# Patient Record
Sex: Male | Born: 1980 | Race: White | Hispanic: No | Marital: Married | Smoking: Never smoker
Health system: Southern US, Community
[De-identification: ages and names within clinical notes are randomized; demographics above are authoritative.]

## PROBLEM LIST (undated history)

## (undated) DIAGNOSIS — G4730 Sleep apnea, unspecified: Secondary | ICD-10-CM

## (undated) HISTORY — PX: HERNIA REPAIR: SHX51

---

## 2006-10-04 ENCOUNTER — Ambulatory Visit: Payer: Self-pay | Admitting: General Surgery

## 2013-04-20 ENCOUNTER — Ambulatory Visit: Payer: Self-pay | Admitting: Internal Medicine

## 2013-05-25 ENCOUNTER — Ambulatory Visit: Payer: Self-pay | Admitting: Internal Medicine

## 2016-02-10 DIAGNOSIS — E785 Hyperlipidemia, unspecified: Secondary | ICD-10-CM | POA: Insufficient documentation

## 2018-01-13 ENCOUNTER — Ambulatory Visit: Payer: BLUE CROSS/BLUE SHIELD | Admitting: Podiatry

## 2018-01-13 DIAGNOSIS — B353 Tinea pedis: Secondary | ICD-10-CM

## 2018-01-13 DIAGNOSIS — B070 Plantar wart: Secondary | ICD-10-CM

## 2018-01-13 MED ORDER — TERBINAFINE HCL 250 MG PO TABS: 250.0000 mg | ORAL_TABLET | Freq: Every day | ORAL | 0 refills | Status: DC

## 2018-01-16 NOTE — Progress Notes (Signed)
   Subjective: 37 year old male presenting today as a new patient with a chief complaint of pain and tenderness to the plantar aspect of the right forefoot secondary to a plantar wart that appeared about one month ago. Walking on the foot increases the pain. He has been soaking the foot in Epsom salt for treatment. Patient is here for further evaluation and treatment.   No past medical history on file.  Objective: Physical Exam General: The patient is alert and oriented x3 in no acute distress.  Dermatology: Hyperkeratotic skin lesion noted to the plantar aspect of the right foot approximately 1 cm in diameter. Pinpoint bleeding noted upon debridement. Pruritus noted to bilateral feet with hyperkeratosis. Skin is warm, dry and supple bilateral lower extremities. Negative for open lesions or macerations.  Vascular: Palpable pedal pulses bilaterally. No edema or erythema noted. Capillary refill within normal limits.  Neurological: Epicritic and protective threshold grossly intact bilaterally.   Musculoskeletal Exam: Pain on palpation to the note skin lesion.  Range of motion within normal limits to all pedal and ankle joints bilateral. Muscle strength 5/5 in all groups bilateral.   Assessment: #1 plantar wart right foot #2 tinea pedis bilateral feet    Plan of Care:  #1 Patient was evaluated. #2 Excisional debridement of the plantar wart lesion was performed using a chisel blade. Cantharone was applied and the lesion was dressed with a dry sterile dressing. #3 Prescription for Lamisil 250 mg #28 provided to patient.  #4 patient is to return to clinic in 2 weeks.  Works Consulting civil engineer at [employer].    Felecia Shelling, DPM [facility]  Dr. Felecia Shelling, DPM    [address]                                        [place], [place] [address]                Office [phone]  Fax [phone]

## 2018-01-27 ENCOUNTER — Encounter: Payer: Self-pay | Admitting: Podiatry

## 2018-01-27 ENCOUNTER — Ambulatory Visit: Payer: BLUE CROSS/BLUE SHIELD | Admitting: Podiatry

## 2018-01-27 DIAGNOSIS — B070 Plantar wart: Secondary | ICD-10-CM | POA: Diagnosis not present

## 2018-01-27 DIAGNOSIS — Z85828 Personal history of other malignant neoplasm of skin: Secondary | ICD-10-CM | POA: Insufficient documentation

## 2018-01-27 DIAGNOSIS — M1990 Unspecified osteoarthritis, unspecified site: Secondary | ICD-10-CM | POA: Insufficient documentation

## 2018-01-27 DIAGNOSIS — G4730 Sleep apnea, unspecified: Secondary | ICD-10-CM | POA: Insufficient documentation

## 2018-01-27 MED ORDER — GENTAMICIN SULFATE 0.1 % EX CREA: 1.0000 "application " | TOPICAL_CREAM | Freq: Three times a day (TID) | CUTANEOUS | 1 refills | Status: DC

## 2018-01-30 NOTE — Progress Notes (Signed)
   Subjective: 37 year old male presenting today for follow up evaluation of a plantar wart of the right foot and tinea pedis of bilateral feet. He states both his complaints have improved and are doing much better. He denies any new complaints at this time. Patient is here for further evaluation and treatment.   No past medical history on file.  Objective: Physical Exam General: The patient is alert and oriented x3 in no acute distress.  Dermatology: Skin is cool, dry and supple bilateral lower extremities. Negative for open lesions or macerations.  Vascular: Palpable pedal pulses bilaterally. No edema or erythema noted. Capillary refill within normal limits.  Neurological: Epicritic and protective threshold grossly intact bilaterally.   Musculoskeletal Exam: All pedal and ankle joints range of motion within normal limits bilateral. Muscle strength 5/5 in all groups bilateral.    Assessment: #1 plantar wart right foot - resolved #2 tinea pedis bilateral feet - resolved    Plan of Care:  #1 Patient was evaluated. #2 Excisional debridement of the plantar wart lesion was performed using a chisel blade. Salinocaine was applied and the lesion was dressed with a dry sterile dressing. #3 Recommended good shoe gear.  #4 patient is to return to clinic as needed.  Works Consulting civil engineer at [employer].    Felecia Shelling, DPM [facility]  Dr. Felecia Shelling, DPM    [address]                                        [place], [place] [address]                Office [phone]  Fax [phone]

## 2019-02-14 ENCOUNTER — Other Ambulatory Visit: Payer: Self-pay | Admitting: Internal Medicine

## 2019-02-14 DIAGNOSIS — N6320 Unspecified lump in the left breast, unspecified quadrant: Secondary | ICD-10-CM

## 2019-02-22 ENCOUNTER — Other Ambulatory Visit: Payer: Self-pay

## 2019-02-22 ENCOUNTER — Ambulatory Visit
Admission: RE | Admit: 2019-02-22 | Discharge: 2019-02-22 | Disposition: A | Discharge status: Home / Self Care | Payer: BC Managed Care – PPO | Source: Ambulatory Visit | Attending: Internal Medicine | Admitting: Internal Medicine

## 2019-02-22 DIAGNOSIS — N6320 Unspecified lump in the left breast, unspecified quadrant: Secondary | ICD-10-CM

## 2019-11-16 IMAGING — MG DIGITAL DIAGNOSTIC BILATERAL MAMMOGRAM WITH TOMO AND CAD
6 of 10 series · 6 of 30 positions shown · non-contrast
Comparison: None

CLINICAL DATA: 37-year-old male with approximately 2 week history
of tenderness in the retroareolar left breast only when touched or
pressure is applied.

EXAM:
DIGITAL DIAGNOSTIC BILATERAL MAMMOGRAM WITH CAD AND TOMO

[R CC synth-2D]
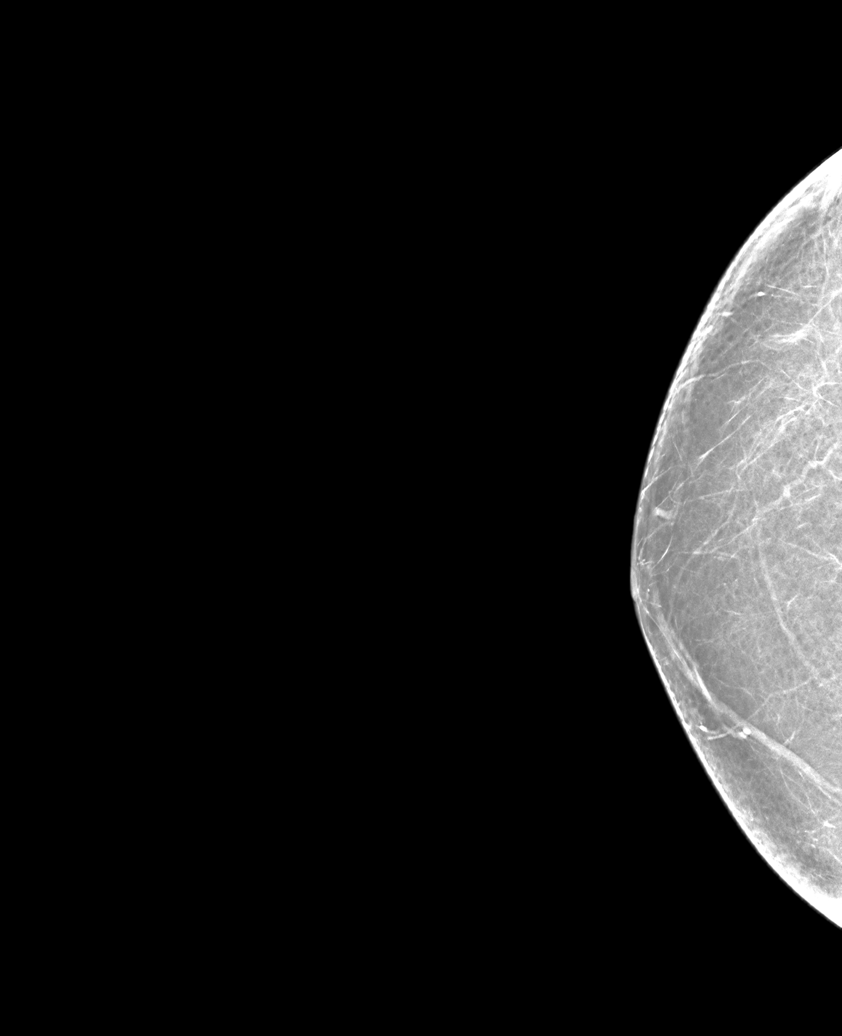

[R MLO synth-2D]
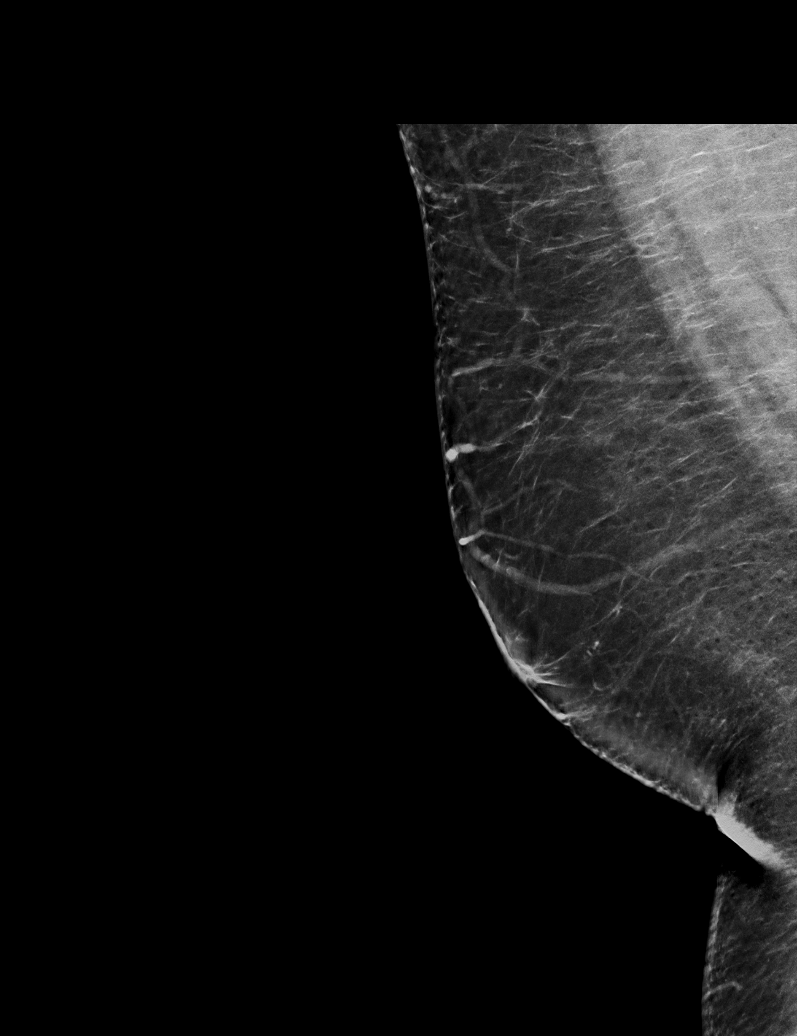

[L CC synth-2D]
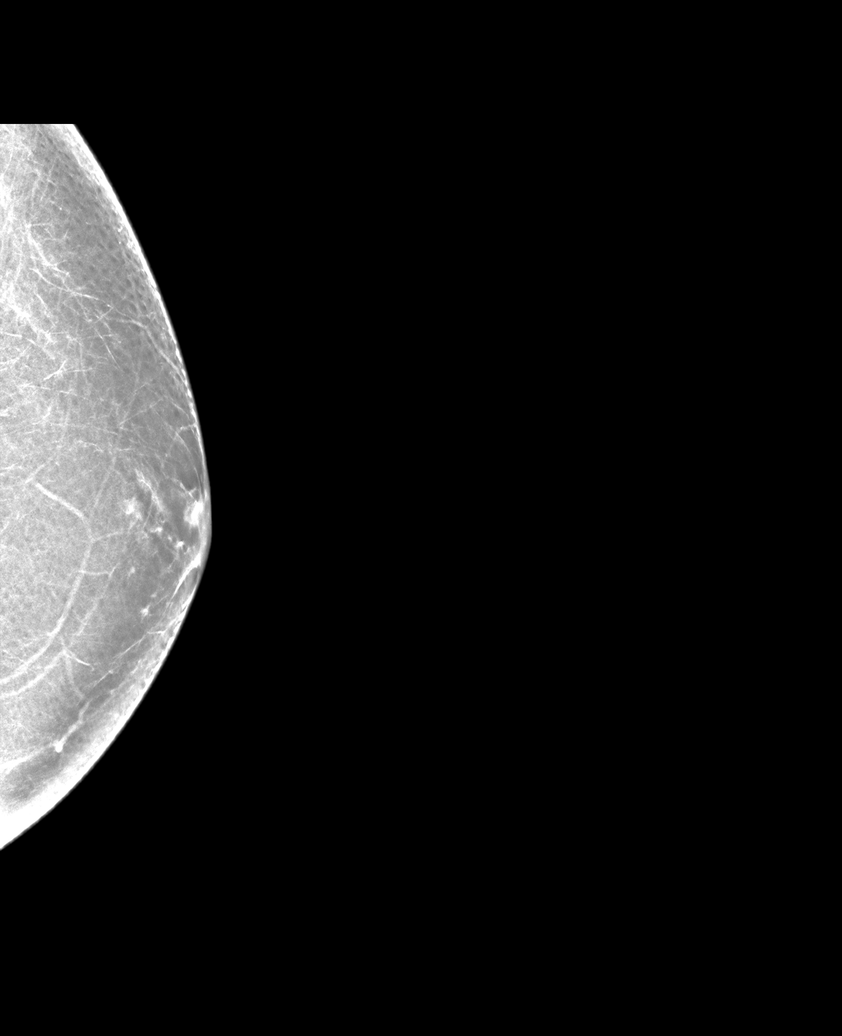

[L MLO synth-2D (1 of 2)]
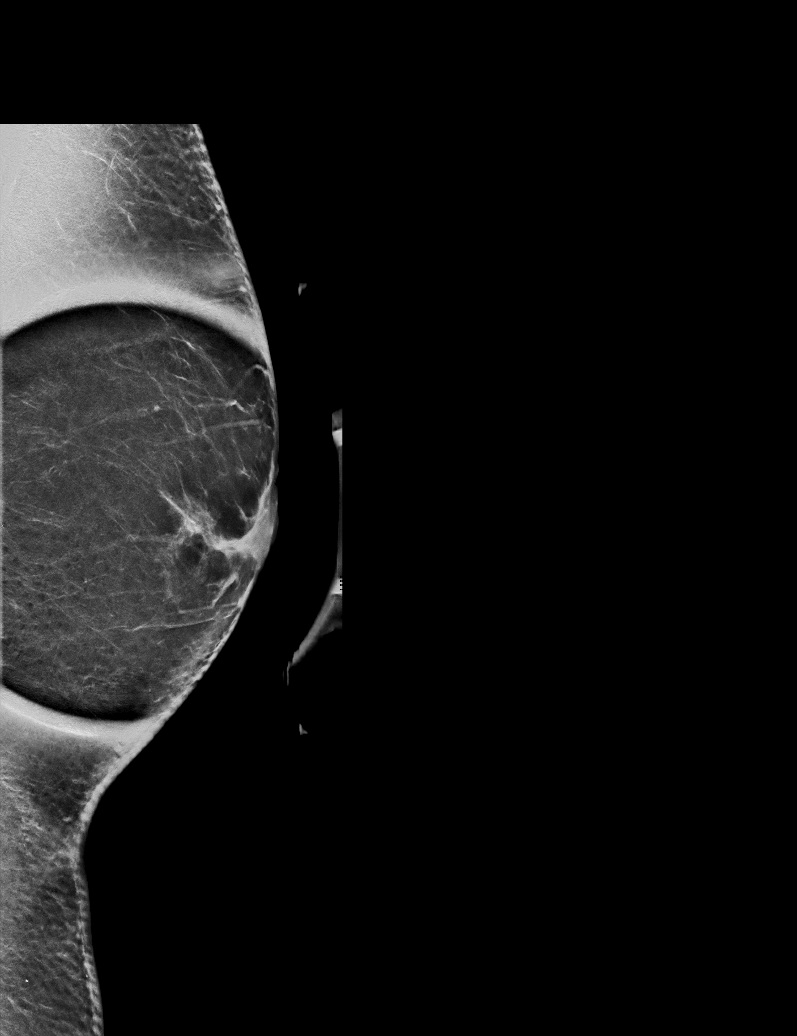

[L MLO synth-2D (2 of 2)]
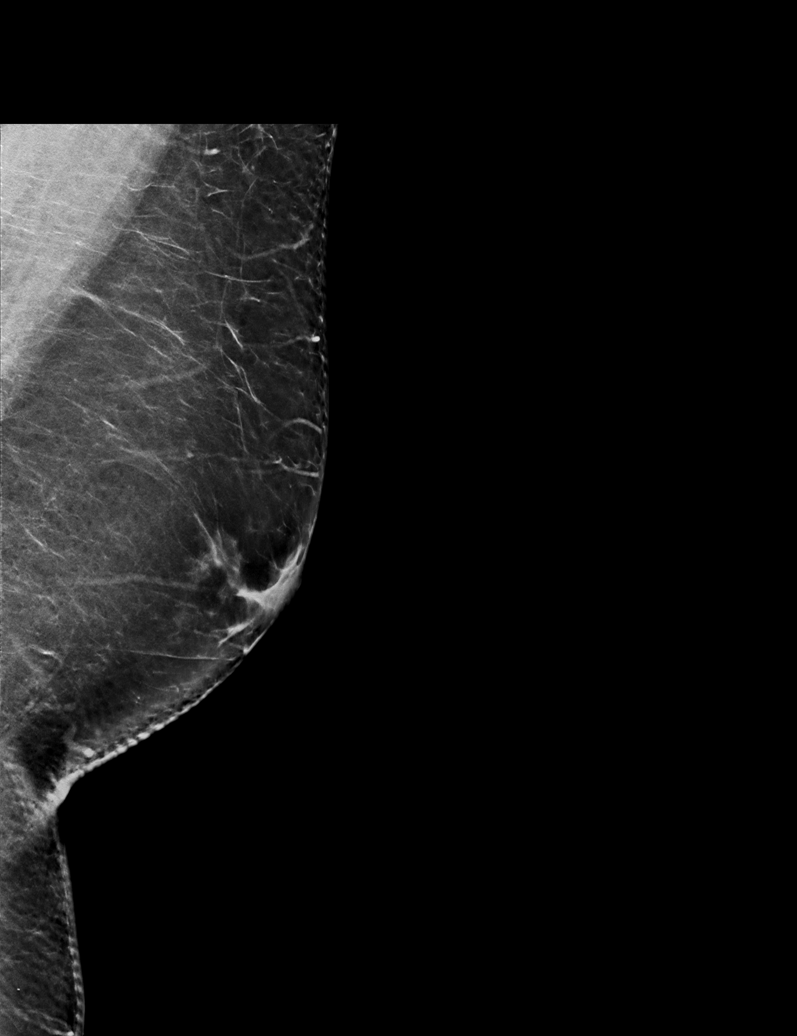

[L MLO tomo · tomo slice 40/79.0]
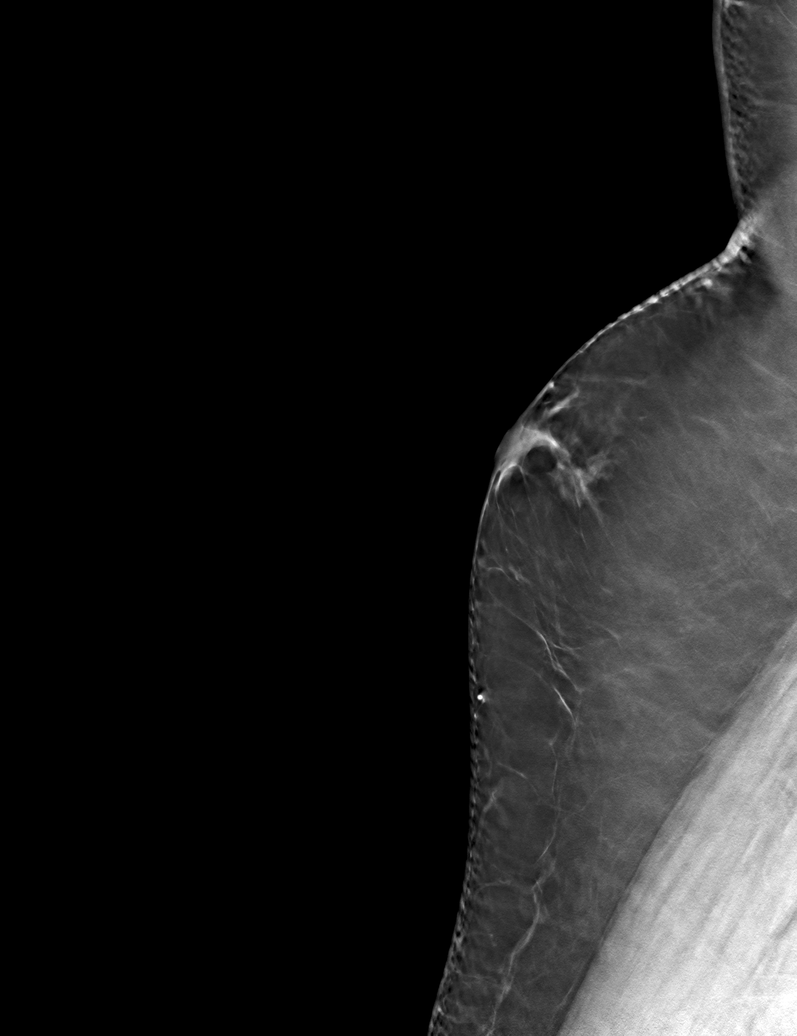

[6 of 30 positions shown; findings below may reference images not displayed]

ACR Breast Density Category b: There are scattered areas of
fibroglandular density.
FINDINGS: The right breast is negative. No mass, gynecomastia, suspicious
microcalcification, or distortion. Right axillary region is
negative.

On the left, there is mild retroareolar fibroglandular tissue
consistent with gynecomastia. No mass, distortion, suspicious
microcalcification. The left axillary region is negative.

Mammographic images were processed with CAD.
IMPRESSION: Mild left gynecomastia.

No evidence of malignancy in either breast.

RECOMMENDATION:
Clinical follow-up.

I have discussed the findings and recommendations with the patient.
Results were also provided in writing at the conclusion of the
visit. If applicable, a reminder letter will be sent to the patient
regarding the next appointment.

BI-RADS CATEGORY  2: Benign.

## 2022-07-04 ENCOUNTER — Ambulatory Visit
Admission: EM | Admit: 2022-07-04 | Discharge: 2022-07-04 | Disposition: A | Discharge status: Home / Self Care | Payer: BC Managed Care – PPO

## 2022-07-04 ENCOUNTER — Ambulatory Visit (INDEPENDENT_AMBULATORY_CARE_PROVIDER_SITE_OTHER): Payer: BC Managed Care – PPO

## 2022-07-04 DIAGNOSIS — S61310A Laceration without foreign body of right index finger with damage to nail, initial encounter: Secondary | ICD-10-CM

## 2022-07-04 DIAGNOSIS — Z23 Encounter for immunization: Secondary | ICD-10-CM | POA: Diagnosis not present

## 2022-07-04 MED ORDER — CEPHALEXIN 500 MG PO CAPS: 500.0000 mg | ORAL_CAPSULE | Freq: Two times a day (BID) | ORAL | 0 refills | Status: AC

## 2022-07-04 MED ORDER — TETANUS-DIPHTH-ACELL PERTUSSIS 5-2.5-18.5 LF-MCG/0.5 IM SUSY
0.5000 mL | PREFILLED_SYRINGE | Freq: Once | INTRAMUSCULAR | Status: AC
  Administered 2022-07-04: 0.5 mL via INTRAMUSCULAR

## 2022-07-04 NOTE — Discharge Instructions (Signed)
Your x-ray was normal.  Recommend wound care and monitoring for signs of infection that include increased redness, swelling, pus as we discussed.  Please change dressing daily and as needed if it becomes soiled.  Your tetanus vaccine was updated today.  I have prescribed an antibiotic given severity of wound.  Please follow-up with hand specialty for further evaluation and management.

## 2022-07-04 NOTE — ED Provider Notes (Signed)
[facility]    CSN: [id number] Arrival date & time: 07/04/22  1359      History   Chief Complaint Chief Complaint  Patient presents with   Finger Injury    HPI Clinton Johns is a 41 y.o. male.   Patient presents with laceration to right index finger that occurred about an hour prior to arrival to urgent care which would have been about 2:30 PM today.  Patient reports that he was using a table saw when his finger accidentally hit the blade.  He denies any numbness or tingling.  He is not sure when his last tetanus vaccine was.  Denies that he takes any blood thinning medications.  Has full range of motion of finger.  Patient reports that he washed it with water given that it had a lot of sawdust in it and then applied a dressing prior to arrival to urgent care.     History reviewed. No pertinent past medical history.  Patient Active Problem List   Diagnosis Date Noted   Arthritis 01/27/2018   History of skin cancer 01/27/2018   Sleep apnea 01/27/2018   Hyperlipidemia 02/10/2016    History reviewed. No pertinent surgical history.     Home Medications    Prior to Admission medications   Medication Sig Start Date End Date Taking? Authorizing Provider  cephALEXin (KEFLEX) 500 MG capsule Take 1 capsule (500 mg total) by mouth 2 (two) times daily for 5 days. 07/04/22 07/09/22 Yes Eddrick Dilone, Acie Fredrickson, FNP  sildenafil (VIAGRA) 100 MG tablet 1/2 to 1 tab PO daily as needed 08/03/21  Yes [provider]  terbinafine (LAMISIL) 250 MG tablet Take 1 tablet (250 mg total) by mouth daily. 01/13/18   Felecia Shelling, DPM    Family History History reviewed. No pertinent family history.  Social History Social History   Tobacco Use   Smoking status: Never   Smokeless tobacco: Never  Substance Use Topics   Alcohol use: Not Currently   Drug use: Never     Allergies   Patient has no known allergies.   Review of Systems Review of Systems Per  HPI  Physical Exam Triage Vital Signs ED Triage Vitals  Enc Vitals Group     BP 07/04/22 1454 135/71     Pulse Rate 07/04/22 1454 74     Resp 07/04/22 1454 18     Temp 07/04/22 1454 98.5 F (36.9 C)     Temp Source 07/04/22 1454 Oral     SpO2 07/04/22 1454 95 %     Weight --      Height --      Head Circumference --      Peak Flow --      Pain Score 07/04/22 1457 0     Pain Loc --      Pain Edu? --      Excl. in GC? --    No data found.  Updated Vital Signs BP 135/71 (BP Location: Left Arm)   Pulse 74   Temp 98.5 F (36.9 C) (Oral)   Resp 18   SpO2 95%   Visual Acuity Right Eye Distance:   Left Eye Distance:   Bilateral Distance:    Right Eye Near:   Left Eye Near:    Bilateral Near:     Physical Exam Constitutional:      General: He is not in acute distress.    Appearance: Normal appearance. He is not toxic-appearing or diaphoretic.  HENT:     Head: Normocephalic and atraumatic.  Eyes:     Extraocular Movements: Extraocular movements intact.     Conjunctiva/sclera: Conjunctivae normal.  Pulmonary:     Effort: Pulmonary effort is normal.  Musculoskeletal:       Hands:     Comments: Patient has full ROM of finger. Neurovascularly intact.   Skin:    Comments: Avulsion laceration present to right index finger that extends distally about an inch. Bleeding controlled. No obvious tendon or bone noted. Left medial portion of nail also removed but nail is intact to nailbed.   Neurological:     General: No focal deficit present.     Mental Status: He is alert and oriented to person, place, and time. Mental status is at baseline.  Psychiatric:        Mood and Affect: Mood normal.        Behavior: Behavior normal.        Thought Content: Thought content normal.        Judgment: Judgment normal.      UC Treatments / Results  Labs (all labs ordered are listed, but only abnormal results are displayed) Labs Reviewed - No data to  display  EKG   Radiology DG Finger Index Right  Result Date: 07/04/2022 CLINICAL DATA:  Finger injury. Index finger pain and laceration after accidental table saw injury. EXAM: RIGHT INDEX FINGER 2+V COMPARISON:  None Available. FINDINGS: Skin and soft tissue irregularity about the radial aspect of the distal digit. No radiopaque foreign body there is no evidence of fracture or dislocation. Minor degenerative spurring of the proximal digit. IMPRESSION: Soft tissue irregularity about the distal digit. No radiopaque foreign body or fracture. Electronically Signed   By: Narda Rutherford M.D.   On: 07/04/2022 15:55    Procedures Procedures (including critical care time)  Medications Ordered in UC Medications  Tdap (BOOSTRIX) injection 0.5 mL (0.5 mLs Intramuscular Given 07/04/22 1511)    Initial Impression / Assessment and Plan / UC Course  I have reviewed the triage vital signs and the nursing notes.  Pertinent labs & imaging results that were available during my care of the patient were reviewed by me and considered in my medical decision making (see chart for details).     Given depth and severity of avulsion laceration, suggested to patient that he go to the ER for further evaluation and management. He adamantly declined going to the ER. Risks associated with not going to the ER were discussed with patient. Patient voiced understanding. No closure is warranted given that a layer of skin was removed and no wound edges can be approximated. Nail is partially removed but still attached to nailbed so no need for removal of nail. Wound care and cleansing of wound completed. Tetanus vaccine also updated. X-ray was completed that showed no acute bony abnormality. Advised patient of wound care. Advised patient to monitor for signs of infection and follow up if they occur. I do think that antibiotic therapy prophylactically is necessary given it was a contaminated wound and given the depth of the  wound. Cephalexin was prescribed for this. I also think that it would be beneficial for patient to see hand specialty so he was provided with contact info and advised to follow up tomorrow to schedule an appt. Patient verbalized understanding and was agreeable with plan.  Final Clinical Impressions(s) / UC Diagnoses   Final diagnoses:  Laceration of right index finger without foreign body with damage to  nail, initial encounter     Discharge Instructions      Your x-ray was normal.  Recommend wound care and monitoring for signs of infection that include increased redness, swelling, pus as we discussed.  Please change dressing daily and as needed if it becomes soiled.  Your tetanus vaccine was updated today.  I have prescribed an antibiotic given severity of wound.  Please follow-up with hand specialty for further evaluation and management.     ED Prescriptions     Medication Sig Dispense Auth. Provider   cephALEXin (KEFLEX) 500 MG capsule Take 1 capsule (500 mg total) by mouth 2 (two) times daily for 5 days. 10 capsule Teodora Medici, [place]      PDMP not reviewed this encounter.   Teodora Medici,  07/04/22 [phone]

## 2022-07-04 NOTE — ED Triage Notes (Signed)
Pt. Presents to UC stating he sliced his right index finger with a table saw today.

## 2023-01-14 ENCOUNTER — Ambulatory Visit: Payer: Self-pay | Admitting: General Surgery

## 2023-01-14 NOTE — H&P (View-Only) (Signed)
HISTORY OF PRESENT ILLNESS:    Clinton Johns is a 41 y.o.male patient who comes for follow up of his umbilical hernia.   Patient previously seen 3 months ago due to umbilical hernia.  At that time he with minimally symptomatic umbilical hernia but with a BMI of 42.  Due to minimal symptomatic and his morbid obesity it was recommended patient to lose weight before considering surgical repair.  He endorses that he has been doing great.  He has lost at least 35 pounds since last visit.  He is feeling more comfortable.  He continued having discomfort in the umbilical area.  He feels very to get this hernia fixed.  He denies any episode of abdominal distention, nausea or vomiting.      PAST MEDICAL HISTORY:  Past Medical History      Past Medical History:  Diagnosis Date   Arthritis      mild, bilateral knees   Skin cancer      followed by Dermatology   Sleep apnea sleep study 08/14    CPAP 5          PAST SURGICAL HISTORY:   Past Surgical History       Past Surgical History:  Procedure Laterality Date   HERNIA REPAIR Bilateral      as a child           MEDICATIONS:  Encounter Medications        Outpatient Encounter Medications as of 12/30/2022  Medication Sig Dispense Refill   sildenafiL (VIAGRA) 100 MG tablet 1/2 to 1 tab PO daily as needed 10 tablet 11   tirzepatide (ZEPBOUND) 2.5 mg/0.5 mL pen injector Inject 0.5 mLs (2.5 mg total) subcutaneously every 7 (seven) days 2 mL 11    No facility-administered encounter medications on file as of 12/30/2022.        ALLERGIES:   Patient has no known allergies.   SOCIAL HISTORY:  Social History  Social History         Socioeconomic History   Marital status: Married  Tobacco Use   Smoking status: Never   Smokeless tobacco: Never  Vaping Use   Vaping status: Never Used  Substance and Sexual Activity   Alcohol use: Yes      Alcohol/week: 0.0 standard drinks of alcohol      Comment: occasional   Drug use: Never   Sexual  activity: Defer  Social History Narrative    Father was adopted.        FAMILY HISTORY:  Family History        Family History  Problem Relation Name Age of Onset   Lung cancer Other grandfather          GENERAL REVIEW OF SYSTEMS:    General ROS: negative for - chills, fatigue, fever, weight gain or weight loss Allergy and Immunology ROS: negative for - hives  Hematological and Lymphatic ROS: negative for - bleeding problems or bruising, negative for palpable nodes Endocrine ROS: negative for - heat or cold intolerance, hair changes Respiratory ROS: negative for - cough, shortness of breath or wheezing Cardiovascular ROS: no chest pain or palpitations GI ROS: negative for nausea, vomiting, abdominal pain, diarrhea, constipation Musculoskeletal ROS: negative for - joint swelling or muscle pain Neurological ROS: negative for - confusion, syncope Dermatological ROS: negative for pruritus and rash   PHYSICAL EXAM:     Vitals:    12/30/22 1516  BP: 111/72  Pulse: 66  .  Ht:185.4 cm (6' 1")   Wt:(!) 129.3 kg (285 lb) BSA:Body surface area is 2.58 meters squared. Body mass index is 37.6 kg/m..   GENERAL: Alert, active, oriented x3   HEENT: Pupils equal reactive to light. Extraocular movements are intact. Sclera clear. Palpebral conjunctiva normal red color.Pharynx clear.   NECK: Supple with no palpable mass and no adenopathy.   LUNGS: Sound clear with no rales rhonchi or wheezes.   HEART: Regular rhythm S1 and S2 without murmur.   ABDOMEN: Soft and depressible, nontender with no palpable mass, no hepatomegaly.  Moderate size umbilical hernia.,  Not completely able to be reduced.   EXTREMITIES: Well-developed well-nourished symmetrical with no dependent edema.   NEUROLOGICAL: Awake alert oriented, facial expression symmetrical, moving all extremities.      IMPRESSION:     Umbilical hernia without obstruction and without gangrene [K42.9]  -Partial incarcerated umbilical  hernia.  The patient has lost 35 pounds.  Now his BMI is 37.  Due to these great decrease in weight I think that patient is ready to proceed with umbilical hernia repair.  We discussed about robotic assisted laparoscopic umbilical hernia repair.  We discussed about the risk of surgery including pain, infection, bleeding, injury to adjacent organs such as intestine, perforation,  obstruction, among others.  The patient reported he understood and agreed to proceed with robotic acid laparoscopic umbilical hernia repair.         PLAN:  1.  Robotic assisted laprscopic ventral hernia repair with mesh (49594) 2. Avoid taking aspirin 5 days before surgery 3. Contact us if you have any concern.    Patient verbalized understanding, all questions were answered, and were agreeable with the plan outlined above.    Kathryne Ramella Cintron-Diaz, MD   Electronically signed by Jmarion Christiano Cintron-Diaz, MD 

## 2023-01-14 NOTE — H&P (Signed)
HISTORY OF PRESENT ILLNESS:    Clinton Johns is a 42 y.o.male patient who comes for follow up of his umbilical hernia.   Patient previously seen 3 months ago due to umbilical hernia.  At that time he with minimally symptomatic umbilical hernia but with a BMI of 42.  Due to minimal symptomatic and his morbid obesity it was recommended patient to lose weight before considering surgical repair.  He endorses that he has been doing great.  He has lost at least 35 pounds since last visit.  He is feeling more comfortable.  He continued having discomfort in the umbilical area.  He feels very to get this hernia fixed.  He denies any episode of abdominal distention, nausea or vomiting.      PAST MEDICAL HISTORY:  Past Medical History      Past Medical History:  Diagnosis Date   Arthritis      mild, bilateral knees   Skin cancer      followed by Dermatology   Sleep apnea sleep study 08/14    CPAP 5          PAST SURGICAL HISTORY:   Past Surgical History       Past Surgical History:  Procedure Laterality Date   HERNIA REPAIR Bilateral      as a child           MEDICATIONS:  Encounter Medications        Outpatient Encounter Medications as of 12/30/2022  Medication Sig Dispense Refill   sildenafiL (VIAGRA) 100 MG tablet 1/2 to 1 tab PO daily as needed 10 tablet 11   tirzepatide (ZEPBOUND) 2.5 mg/0.5 mL pen injector Inject 0.5 mLs (2.5 mg total) subcutaneously every 7 (seven) days 2 mL 11    No facility-administered encounter medications on file as of 12/30/2022.        ALLERGIES:   Patient has no known allergies.   SOCIAL HISTORY:  Social History  Social History         Socioeconomic History   Marital status: Married  Tobacco Use   Smoking status: Never   Smokeless tobacco: Never  Vaping Use   Vaping status: Never Used  Substance and Sexual Activity   Alcohol use: Yes      Alcohol/week: 0.0 standard drinks of alcohol      Comment: occasional   Drug use: Never   Sexual  activity: Defer  Social History Narrative    Father was adopted.        FAMILY HISTORY:  Family History        Family History  Problem Relation Name Age of Onset   Lung cancer Other grandfather          GENERAL REVIEW OF SYSTEMS:    General ROS: negative for - chills, fatigue, fever, weight gain or weight loss Allergy and Immunology ROS: negative for - hives  Hematological and Lymphatic ROS: negative for - bleeding problems or bruising, negative for palpable nodes Endocrine ROS: negative for - heat or cold intolerance, hair changes Respiratory ROS: negative for - cough, shortness of breath or wheezing Cardiovascular ROS: no chest pain or palpitations GI ROS: negative for nausea, vomiting, abdominal pain, diarrhea, constipation Musculoskeletal ROS: negative for - joint swelling or muscle pain Neurological ROS: negative for - confusion, syncope Dermatological ROS: negative for pruritus and rash   PHYSICAL EXAM:     Vitals:    12/30/22 1516  BP: 111/72  Pulse: 66  .  Ht:185.4 cm (6\' 1" )  Wt:(!) 129.3 kg (285 lb) ZOX:WRUE surface area is 2.58 meters squared. Body mass index is 37.6 kg/m.Marland Kitchen   GENERAL: Alert, active, oriented x3   HEENT: Pupils equal reactive to light. Extraocular movements are intact. Sclera clear. Palpebral conjunctiva normal red color.Pharynx clear.   NECK: Supple with no palpable mass and no adenopathy.   LUNGS: Sound clear with no rales rhonchi or wheezes.   HEART: Regular rhythm S1 and S2 without murmur.   ABDOMEN: Soft and depressible, nontender with no palpable mass, no hepatomegaly.  Moderate size umbilical hernia.,  Not completely able to be reduced.   EXTREMITIES: Well-developed well-nourished symmetrical with no dependent edema.   NEUROLOGICAL: Awake alert oriented, facial expression symmetrical, moving all extremities.      IMPRESSION:     Umbilical hernia without obstruction and without gangrene [K42.9]  -Partial incarcerated umbilical  hernia.  The patient has lost 35 pounds.  Now his BMI is 37.  Due to these great decrease in weight I think that patient is ready to proceed with umbilical hernia repair.  We discussed about robotic assisted laparoscopic umbilical hernia repair.  We discussed about the risk of surgery including pain, infection, bleeding, injury to adjacent organs such as intestine, perforation,  obstruction, among others.  The patient reported he understood and agreed to proceed with robotic acid laparoscopic umbilical hernia repair.         PLAN:  1.  Robotic assisted laprscopic ventral hernia repair with mesh ([id number]) 2. Avoid taking aspirin 5 days before surgery 3. Contact us if you have any concern.    Patient verbalized understanding, all questions were answered, and were agreeable with the plan outlined above.    Carolan Shiver, MD   Electronically signed by Carolan Shiver, MD

## 2023-01-27 ENCOUNTER — Encounter
Admission: RE | Admit: 2023-01-27 | Discharge: 2023-01-27 | Disposition: A | Discharge status: Home / Self Care | Payer: BC Managed Care – PPO | Source: Ambulatory Visit | Attending: General Surgery | Admitting: General Surgery

## 2023-01-27 ENCOUNTER — Other Ambulatory Visit: Payer: Self-pay

## 2023-01-27 HISTORY — DX: Sleep apnea, unspecified: G47.30

## 2023-01-27 NOTE — Patient Instructions (Signed)
Your procedure is scheduled on: Friday 02/04/23 To find out your arrival time, please call [phone] between 1PM - 3PM on:   Thursday 02/03/23 Report to the Registration Desk on the 1st floor of the [facility]. Valet parking is available.  If your arrival time is 6:00 am, do not arrive before that time as the [facility] entrance doors do not open until 6:00 am.  REMEMBER: Instructions that are not followed completely may result in serious medical risk, up to and including death; or upon the discretion of your surgeon and anesthesiologist your surgery may need to be rescheduled.  Do not eat food or drink any liquids after midnight the night before surgery.  No gum chewing or hard candies.  One week prior to surgery: Stop Anti-inflammatories (NSAIDS) such as Advil, Aleve, Ibuprofen, Motrin, Naproxen, Naprosyn and Aspirin based products such as Excedrin, Goody's Powder, BC Powder. You may however, continue to take Tylenol if needed for pain up until the day of surgery.  Stop ANY OVER THE COUNTER supplements or vitamins until after surgery.  Continue taking all prescribed medications.  TAKE ONLY THESE MEDICATIONS THE MORNING OF SURGERY WITH A SIP OF WATER:  none  No Alcohol for 24 hours before or after surgery.  No Smoking including e-cigarettes for 24 hours before surgery.  No chewable tobacco products for at least 6 hours before surgery.  No nicotine patches on the day of surgery.  Do not use any "recreational" drugs for at least a week (preferably 2 weeks) before your surgery.  Please be advised that the combination of cocaine and anesthesia may have negative outcomes, up to and including death. If you test positive for cocaine, your surgery will be cancelled.  On the morning of surgery brush your teeth with toothpaste and water, you may rinse your mouth with mouthwash if you wish. Do not swallow any toothpaste or mouthwash.  Use CHG Soap or wipes as directed on  instruction sheet. You may pick this up at our office OR if you prefer you can use antibacterial Dial soap.  Do not wear lotions, powders, or perfumes.   Do not shave body hair from the neck down 48 hours before surgery.  Wear comfortable clothing (specific to your surgery type) to the hospital.  Do not wear jewelry, make-up, hairpins, clips or nail polish.  Contact lenses, hearing aids and dentures may not be worn into surgery.  Bring your C-PAP to the hospital in case you may have to spend the night.   Do not bring valuables to the hospital. [facility] is not responsible for any missing/lost belongings or valuables.   Notify your doctor if there is any change in your medical condition (cold, fever, infection).  If you are being discharged the day of surgery, you will not be allowed to drive home. You will need a responsible individual to drive you home and stay with you for 24 hours after surgery.   If you are taking public transportation, you will need to have a responsible individual with you.  If you are being admitted to the hospital overnight, leave your suitcase in the car. After surgery it may be brought to your room.  In case of increased patient census, it may be necessary for you, the patient, to continue your postoperative care in the Same Day Surgery department.  After surgery, you can help prevent lung complications by doing breathing exercises.  Take deep breaths and cough every 1-2 hours. Your doctor may order a  device called an Incentive Spirometer to help you take deep breaths. When coughing or sneezing, hold a pillow firmly against your incision with both hands. This is called "splinting." Doing this helps protect your incision. It also decreases belly discomfort.  Surgery Visitation Policy:  Patients undergoing a surgery or procedure may have two family members or support persons with them as long as the person is not COVID-19 positive or experiencing its  symptoms.   Inpatient Visitation:    Visiting hours are 7 a.m. to 8 p.m. Up to four visitors are allowed at one time in a patient room. The visitors may rotate out with other people during the day. One designated support person (adult) may remain overnight.  Please call the Pre-admissions Testing Dept. at [phone] if you have any questions about these instructions.     Preparing for Surgery with CHLORHEXIDINE GLUCONATE (CHG) Soap  Chlorhexidine Gluconate (CHG) Soap  o An antiseptic cleaner that kills germs and bonds with the skin to continue killing germs even after washing  o Used for showering the night before surgery and morning of surgery  Before surgery, you can play an important role by reducing the number of germs on your skin.  CHG (Chlorhexidine gluconate) soap is an antiseptic cleanser which kills germs and bonds with the skin to continue killing germs even after washing.  Please do not use if you have an allergy to CHG or antibacterial soaps. If your skin becomes reddened/irritated stop using the CHG.  1. Shower the NIGHT BEFORE SURGERY and the MORNING OF SURGERY with CHG soap.  2. If you choose to wash your hair, wash your hair first as usual with your normal shampoo.  3. After shampooing, rinse your hair and body thoroughly to remove the shampoo.  4. Use CHG as you would any other liquid soap. You can apply CHG directly to the skin and wash gently with a scrungie or a clean washcloth.  5. Apply the CHG soap to your body only from the neck down. Do not use on open wounds or open sores. Avoid contact with your eyes, ears, mouth, and genitals (private parts). Wash face and genitals (private parts) with your normal soap.  6. Wash thoroughly, paying special attention to the area where your surgery will be performed.  7. Thoroughly rinse your body with warm water.  8. Do not shower/wash with your normal soap after using and rinsing off the CHG soap.  9. Pat  yourself dry with a clean towel.  10. Wear clean pajamas to bed the night before surgery.  12. Place clean sheets on your bed the night of your first shower and do not sleep with pets.  13. Shower again with the CHG soap on the day of surgery prior to arriving at the hospital.  14. Do not apply any deodorants/lotions/powders.  15. Please wear clean clothes to the hospital.

## 2023-02-04 ENCOUNTER — Ambulatory Visit: Payer: BC Managed Care – PPO | Admitting: Anesthesiology

## 2023-02-04 ENCOUNTER — Encounter
Admission: RE | Disposition: A | Discharge status: Home / Self Care | Payer: Self-pay | Source: Home / Self Care | Attending: General Surgery

## 2023-02-04 ENCOUNTER — Other Ambulatory Visit: Payer: Self-pay

## 2023-02-04 ENCOUNTER — Encounter: Payer: Self-pay | Admitting: General Surgery

## 2023-02-04 ENCOUNTER — Ambulatory Visit
Admission: RE | Admit: 2023-02-04 | Discharge: 2023-02-04 | Disposition: A | Discharge status: Home / Self Care | Payer: BC Managed Care – PPO | Attending: General Surgery | Admitting: General Surgery

## 2023-02-04 DIAGNOSIS — G4730 Sleep apnea, unspecified: Secondary | ICD-10-CM | POA: Diagnosis not present

## 2023-02-04 DIAGNOSIS — K420 Umbilical hernia with obstruction, without gangrene: Secondary | ICD-10-CM | POA: Insufficient documentation

## 2023-02-04 DIAGNOSIS — Z6836 Body mass index (BMI) 36.0-36.9, adult: Secondary | ICD-10-CM | POA: Insufficient documentation

## 2023-02-04 HISTORY — PX: XI ROBOTIC ASSISTED VENTRAL HERNIA: SHX6789

## 2023-02-04 SURGERY — REPAIR, HERNIA, VENTRAL, ROBOT-ASSISTED
Anesthesia: General | Site: Abdomen

## 2023-02-04 MED ORDER — FAMOTIDINE 20 MG PO TABS
ORAL_TABLET | ORAL | Status: AC
  Filled 2023-02-04: qty 1

## 2023-02-04 MED ORDER — MIDAZOLAM HCL 2 MG/2ML IJ SOLN
INTRAMUSCULAR | Status: DC | PRN
  Administered 2023-02-04: 2 mg via INTRAVENOUS

## 2023-02-04 MED ORDER — CEFAZOLIN SODIUM-DEXTROSE 2-4 GM/100ML-% IV SOLN
INTRAVENOUS | Status: AC
  Filled 2023-02-04: qty 100

## 2023-02-04 MED ORDER — SUGAMMADEX SODIUM 200 MG/2ML IV SOLN
INTRAVENOUS | Status: DC | PRN
  Administered 2023-02-04: 200 mg via INTRAVENOUS

## 2023-02-04 MED ORDER — ACETAMINOPHEN 500 MG PO TABS
ORAL_TABLET | ORAL | Status: AC
  Filled 2023-02-04: qty 2

## 2023-02-04 MED ORDER — GABAPENTIN 300 MG PO CAPS
300.0000 mg | ORAL_CAPSULE | ORAL | Status: AC
  Administered 2023-02-04: 300 mg via ORAL

## 2023-02-04 MED ORDER — SUCCINYLCHOLINE CHLORIDE 200 MG/10ML IV SOSY
PREFILLED_SYRINGE | INTRAVENOUS | Status: DC | PRN
  Administered 2023-02-04: 200 mg via INTRAVENOUS

## 2023-02-04 MED ORDER — BUPIVACAINE HCL (PF) 0.25 % IJ SOLN
INTRAMUSCULAR | Status: AC
  Filled 2023-02-04: qty 30

## 2023-02-04 MED ORDER — CHLORHEXIDINE GLUCONATE 0.12 % MT SOLN
15.0000 mL | Freq: Once | OROMUCOSAL | Status: AC
  Administered 2023-02-04: 15 mL via OROMUCOSAL

## 2023-02-04 MED ORDER — FENTANYL CITRATE (PF) 100 MCG/2ML IJ SOLN
25.0000 ug | INTRAMUSCULAR | Status: DC | PRN
  Administered 2023-02-04 (×3): 25 ug via INTRAVENOUS

## 2023-02-04 MED ORDER — ROCURONIUM BROMIDE 100 MG/10ML IV SOLN
INTRAVENOUS | Status: DC | PRN
  Administered 2023-02-04: 10 mg via INTRAVENOUS
  Administered 2023-02-04: 50 mg via INTRAVENOUS

## 2023-02-04 MED ORDER — CEFAZOLIN SODIUM-DEXTROSE 2-4 GM/100ML-% IV SOLN
2.0000 g | INTRAVENOUS | Status: AC
  Administered 2023-02-04: 2 g via INTRAVENOUS

## 2023-02-04 MED ORDER — BUPIVACAINE LIPOSOME 1.3 % IJ SUSP
INTRAMUSCULAR | Status: AC
  Filled 2023-02-04: qty 20

## 2023-02-04 MED ORDER — FENTANYL CITRATE (PF) 100 MCG/2ML IJ SOLN
INTRAMUSCULAR | Status: DC | PRN
  Administered 2023-02-04: 100 ug via INTRAVENOUS

## 2023-02-04 MED ORDER — BUPIVACAINE-EPINEPHRINE (PF) 0.25% -1:200000 IJ SOLN
INTRAMUSCULAR | Status: DC | PRN
  Administered 2023-02-04: 30 mL

## 2023-02-04 MED ORDER — FAMOTIDINE 20 MG PO TABS
20.0000 mg | ORAL_TABLET | Freq: Once | ORAL | Status: AC
  Administered 2023-02-04: 20 mg via ORAL

## 2023-02-04 MED ORDER — FENTANYL CITRATE (PF) 100 MCG/2ML IJ SOLN
INTRAMUSCULAR | Status: AC
  Filled 2023-02-04: qty 2

## 2023-02-04 MED ORDER — DEXAMETHASONE SODIUM PHOSPHATE 10 MG/ML IJ SOLN
INTRAMUSCULAR | Status: DC | PRN
  Administered 2023-02-04: 10 mg via INTRAVENOUS

## 2023-02-04 MED ORDER — BUPIVACAINE LIPOSOME 1.3 % IJ SUSP
INTRAMUSCULAR | Status: DC | PRN
  Administered 2023-02-04: 20 mL

## 2023-02-04 MED ORDER — 0.9 % SODIUM CHLORIDE (POUR BTL) OPTIME
TOPICAL | Status: DC | PRN
  Administered 2023-02-04: 500 mL

## 2023-02-04 MED ORDER — ONDANSETRON HCL 4 MG/2ML IJ SOLN
INTRAMUSCULAR | Status: DC | PRN
  Administered 2023-02-04: 4 mg via INTRAVENOUS

## 2023-02-04 MED ORDER — ACETAMINOPHEN 500 MG PO TABS
1000.0000 mg | ORAL_TABLET | ORAL | Status: AC
  Administered 2023-02-04: 1000 mg via ORAL

## 2023-02-04 MED ORDER — CHLORHEXIDINE GLUCONATE 0.12 % MT SOLN
OROMUCOSAL | Status: AC
  Filled 2023-02-04: qty 15

## 2023-02-04 MED ORDER — LIDOCAINE HCL (CARDIAC) PF 100 MG/5ML IV SOSY
PREFILLED_SYRINGE | INTRAVENOUS | Status: DC | PRN
  Administered 2023-02-04: 100 mg via INTRAVENOUS

## 2023-02-04 MED ORDER — LACTATED RINGERS IV SOLN: INTRAVENOUS | Status: DC

## 2023-02-04 MED ORDER — OXYCODONE HCL 5 MG PO TABS
5.0000 mg | ORAL_TABLET | Freq: Once | ORAL | Status: AC | PRN
  Administered 2023-02-04: 5 mg via ORAL

## 2023-02-04 MED ORDER — GABAPENTIN 300 MG PO CAPS
ORAL_CAPSULE | ORAL | Status: AC
  Filled 2023-02-04: qty 1

## 2023-02-04 MED ORDER — PROPOFOL 10 MG/ML IV BOLUS
INTRAVENOUS | Status: DC | PRN
  Administered 2023-02-04: 200 mg via INTRAVENOUS

## 2023-02-04 MED ORDER — ORAL CARE MOUTH RINSE: 15.0000 mL | Freq: Once | OROMUCOSAL | Status: AC

## 2023-02-04 MED ORDER — HYDROCODONE-ACETAMINOPHEN 5-325 MG PO TABS: 1.0000 | ORAL_TABLET | ORAL | 0 refills | Status: AC | PRN

## 2023-02-04 MED ORDER — CELECOXIB 200 MG PO CAPS
ORAL_CAPSULE | ORAL | Status: AC
  Filled 2023-02-04: qty 1

## 2023-02-04 MED ORDER — CELECOXIB 200 MG PO CAPS
200.0000 mg | ORAL_CAPSULE | ORAL | Status: AC
  Administered 2023-02-04: 200 mg via ORAL

## 2023-02-04 MED ORDER — OXYCODONE HCL 5 MG PO TABS
ORAL_TABLET | ORAL | Status: AC
  Filled 2023-02-04: qty 1

## 2023-02-04 MED ORDER — MIDAZOLAM HCL 2 MG/2ML IJ SOLN
INTRAMUSCULAR | Status: AC
  Filled 2023-02-04: qty 2

## 2023-02-04 MED ORDER — OXYCODONE HCL 5 MG/5ML PO SOLN: 5.0000 mg | Freq: Once | ORAL | Status: AC | PRN

## 2023-02-04 MED ORDER — EPINEPHRINE PF 1 MG/ML IJ SOLN
INTRAMUSCULAR | Status: AC
  Filled 2023-02-04: qty 1

## 2023-02-04 SURGICAL SUPPLY — 51 items
ADH SKN CLS APL DERMABOND .7 (GAUZE/BANDAGES/DRESSINGS) ×1
BAG PRESSURE INF REUSE 1000 (BAG) IMPLANT
BLADE SURG SZ11 CARB STEEL (BLADE) ×1 IMPLANT
COVER TIP SHEARS 8 DVNC (MISCELLANEOUS) ×1 IMPLANT
COVER WAND RF STERILE (DRAPES) ×1 IMPLANT
DERMABOND ADVANCED .7 DNX12 (GAUZE/BANDAGES/DRESSINGS) ×1 IMPLANT
DRAPE ARM DVNC X/XI (DISPOSABLE) ×3 IMPLANT
DRAPE COLUMN DVNC XI (DISPOSABLE) ×1 IMPLANT
ELECT REM PT RETURN 9FT ADLT (ELECTROSURGICAL) ×1
ELECTRODE REM PT RTRN 9FT ADLT (ELECTROSURGICAL) ×1 IMPLANT
FORCEPS BPLR R/ABLATION 8 DVNC (INSTRUMENTS) ×1 IMPLANT
GLOVE BIO SURGEON STRL SZ 6.5 (GLOVE) ×2 IMPLANT
GLOVE BIOGEL PI IND STRL 6.5 (GLOVE) ×2 IMPLANT
GOWN STRL REUS W/ TWL LRG LVL3 (GOWN DISPOSABLE) ×3 IMPLANT
GOWN STRL REUS W/TWL LRG LVL3 (GOWN DISPOSABLE) ×3
IRRIGATOR SUCT 8 DISP DVNC XI (IRRIGATION / IRRIGATOR) IMPLANT
IV CATH ANGIO 12GX3 LT BLUE (NEEDLE) IMPLANT
IV NS 1000ML (IV SOLUTION)
IV NS 1000ML BAXH (IV SOLUTION) IMPLANT
KIT PINK PAD W/HEAD ARE REST (MISCELLANEOUS) ×1
KIT PINK PAD W/HEAD ARM REST (MISCELLANEOUS) ×1 IMPLANT
LABEL OR SOLS (LABEL) ×1 IMPLANT
MANIFOLD NEPTUNE II (INSTRUMENTS) ×1 IMPLANT
MESH PROGRIP HERNIA FLAT 15X15 (Mesh General) IMPLANT
MESH PROGRIP LAP SELF FIXATING (Mesh General) ×1 IMPLANT
MESH VENTRALIGHT ST 4.5IN (Mesh General) IMPLANT
MESH VENTRALIGHT ST 4X6IN (Mesh General) IMPLANT
NDL DRIVE SUT CUT DVNC (INSTRUMENTS) ×1 IMPLANT
NDL HYPO 22X1.5 SAFETY MO (MISCELLANEOUS) ×1 IMPLANT
NDL INSUFFLATION 14GA 120MM (NEEDLE) ×1 IMPLANT
NEEDLE DRIVE SUT CUT DVNC (INSTRUMENTS) ×1 IMPLANT
NEEDLE HYPO 22X1.5 SAFETY MO (MISCELLANEOUS) ×1 IMPLANT
NEEDLE INSUFFLATION 14GA 120MM (NEEDLE) ×1 IMPLANT
NS IRRIG 500ML POUR BTL (IV SOLUTION) ×1 IMPLANT
OBTURATOR OPTICAL STND 8 DVNC (TROCAR) ×1
OBTURATOR OPTICALSTD 8 DVNC (TROCAR) ×1 IMPLANT
PACK LAP CHOLECYSTECTOMY (MISCELLANEOUS) ×1 IMPLANT
SCISSORS MNPLR CVD DVNC XI (INSTRUMENTS) ×1 IMPLANT
SEAL UNIV 5-12 XI (MISCELLANEOUS) ×3 IMPLANT
SET TUBE SMOKE EVAC HIGH FLOW (TUBING) ×1 IMPLANT
SOL ELECTROSURG ANTI STICK (MISCELLANEOUS) ×1
SOLUTION ELECTROSURG ANTI STCK (MISCELLANEOUS) ×1 IMPLANT
SUT MNCRL 4-0 (SUTURE) ×1
SUT MNCRL 4-0 27XMFL (SUTURE) ×1
SUT STRATAFIX PDS 30 CT-1 (SUTURE) ×1 IMPLANT
SUT VICRYL 0 UR6 27IN ABS (SUTURE) ×1 IMPLANT
SUT VLOC 90 2/L VL 12 GS22 (SUTURE) ×2 IMPLANT
SUTURE MNCRL 4-0 27XMF (SUTURE) ×1 IMPLANT
TAPE TRANSPORE STRL 2 31045 (GAUZE/BANDAGES/DRESSINGS) ×1 IMPLANT
TRAP FLUID SMOKE EVACUATOR (MISCELLANEOUS) ×1 IMPLANT
WATER STERILE IRR 500ML POUR (IV SOLUTION) ×1 IMPLANT

## 2023-02-04 NOTE — Anesthesia Preprocedure Evaluation (Signed)
Anesthesia Evaluation  Patient identified by MRN, date of birth, ID band Patient awake    Reviewed: Allergy & Precautions, NPO status , Patient's Chart, lab work & pertinent test results  History of Anesthesia Complications Negative for: history of anesthetic complications  Airway Mallampati: III  TM Distance: <3 FB Neck ROM: full    Dental  (+) Chipped   Pulmonary neg shortness of breath, sleep apnea and Continuous Positive Airway Pressure Ventilation    Pulmonary exam normal        Cardiovascular Exercise Tolerance: Good (-) angina (-) Past MI negative cardio ROS Normal cardiovascular exam     Neuro/Psych negative neurological ROS  negative psych ROS   GI/Hepatic negative GI ROS, Neg liver ROS,neg GERD  ,,  Endo/Other  negative endocrine ROS    Renal/GU      Musculoskeletal   Abdominal   Peds  Hematology negative hematology ROS (+)   Anesthesia Other Findings Past Medical History: No date: Sleep apnea     Comment:  cpap  Past Surgical History: No date: HERNIA REPAIR; Bilateral     Comment:  groin No date: HERNIA REPAIR     Comment:  lower abdomen  BMI    Body Mass Index: 36.68 kg/m      Reproductive/Obstetrics negative OB ROS                             Anesthesia Physical Anesthesia Plan  ASA: 3  Anesthesia Plan: General ETT   Post-op Pain Management:    Induction: Intravenous  PONV Risk Score and Plan: Ondansetron, Dexamethasone, Midazolam and Treatment may vary due to age or medical condition  Airway Management Planned: Oral ETT  Additional Equipment:   Intra-op Plan:   Post-operative Plan: Extubation in OR  Informed Consent: I have reviewed the patients History and Physical, chart, labs and discussed the procedure including the risks, benefits and alternatives for the proposed anesthesia with the patient or authorized representative who has indicated  his/her understanding and acceptance.     Dental Advisory Given  Plan Discussed with: Anesthesiologist, CRNA and Surgeon  Anesthesia Plan Comments: (Patient consented for risks of anesthesia including but not limited to:  - adverse reactions to medications - damage to eyes, teeth, lips or other oral mucosa - nerve damage due to positioning  - sore throat or hoarseness - Damage to heart, brain, nerves, lungs, other parts of body or loss of life  Patient voiced understanding.)       Anesthesia Quick Evaluation

## 2023-02-04 NOTE — Interval H&P Note (Signed)
History and Physical Interval Note:  02/04/2023 7:03 AM  Clinton Johns  has presented today for surgery, with the diagnosis of umbilical hernia w/o obstruction and w/o gangrene K42.9.  The various methods of treatment have been discussed with the patient and family. After consideration of risks, benefits and other options for treatment, the patient has consented to  Procedure(s): XI ROBOTIC ASSISTED VENTRAL HERNIA w/ mesh (N/A) as a surgical intervention.  The patient's history has been reviewed, patient examined, no change in status, stable for surgery.  I have reviewed the patient's chart and labs.  Questions were answered to the patient's satisfaction.     Carolan Shiver

## 2023-02-04 NOTE — Anesthesia Procedure Notes (Signed)
Procedure Name: Intubation Date/Time: 02/04/2023 7:41 AM  Performed by: Milagros Reap, CRNAPre-anesthesia Checklist: Patient identified, Emergency Drugs available, Suction available and Patient being monitored Patient Re-evaluated:Patient Re-evaluated prior to induction Oxygen Delivery Method: Circle system utilized Preoxygenation: Pre-oxygenation with 100% oxygen Induction Type: IV induction Ventilation: Mask ventilation without difficulty Laryngoscope Size: McGraph and 4 Grade View: Grade I Tube type: Oral Tube size: 7.5 mm Number of attempts: 1 Airway Equipment and Method: Stylet and Oral airway Placement Confirmation: ETT inserted through vocal cords under direct vision, positive ETCO2 and breath sounds checked- equal and bilateral Secured at: 23 (lip) cm Tube secured with: Tape Dental Injury: Teeth and Oropharynx as per pre-operative assessment  Comments: Eyes taped ou, soft bite block

## 2023-02-04 NOTE — Transfer of Care (Signed)
Immediate Anesthesia Transfer of Care Note  Patient: Clinton Johns  Procedure(s) Performed: XI ROBOTIC ASSISTED VENTRAL HERNIA w/ mesh (Abdomen)  Patient Location: PACU  Anesthesia Type:General  Level of Consciousness: awake, alert , and oriented  Airway & Oxygen Therapy: Patient Spontanous Breathing and Patient connected to face mask oxygen  Post-op Assessment: Report given to RN and Post -op Vital signs reviewed and stable  Post vital signs: Reviewed and stable  Last Vitals:  Vitals Value Taken Time  BP 138/93 02/04/23 0945  Temp 36.4 C 02/04/23 0943  Pulse 76 02/04/23 0947  Resp 22 02/04/23 0947  SpO2 100 % 02/04/23 0947  Vitals shown include unvalidated device data.  Last Pain:  Vitals:   02/04/23 0943  TempSrc:   PainSc: Asleep         Complications: No notable events documented.

## 2023-02-04 NOTE — Discharge Instructions (Addendum)
  Diet: Resume home heart healthy regular diet.   Activity: No heavy lifting >20 pounds (children, pets, laundry, garbage) or strenuous activity until follow-up, but light activity and walking are encouraged. Do not drive or drink alcohol if taking narcotic pain medications.  Wound care: May shower with soapy water and pat dry (do not rub incisions), but no baths or submerging incision underwater until follow-up. (no swimming)   Medications: Resume all home medications. For mild to moderate pain: acetaminophen (Tylenol) ***or ibuprofen (if no kidney disease). Combining Tylenol with alcohol can substantially increase your risk of causing liver disease. Narcotic pain medications, if prescribed, can be used for severe pain, though may cause nausea, constipation, and drowsiness. Do not combine Tylenol and Norco within a 6 hour period as Norco contains Tylenol. If you do not need the narcotic pain medication, you do not need to fill the prescription.  Call office ([phone]) at any time if any questions, worsening pain, fevers/chills, bleeding, drainage from incision site, or other concerns.   AMBULATORY SURGERY  DISCHARGE INSTRUCTIONS   The drugs that you were given will stay in your system until tomorrow so for the next 24 hours you should not:  Drive an automobile Make any legal decisions Drink any alcoholic beverage   You may resume regular meals tomorrow.  Today it is better to start with liquids and gradually work up to solid foods.  You may eat anything you prefer, but it is better to start with liquids, then soup and crackers, and gradually work up to solid foods.   Please notify your doctor immediately if you have any unusual bleeding, trouble breathing, redness and pain at the surgery site, drainage, fever, or pain not relieved by medication.    Additional Instructions:        Please contact your physician with any problems or Same Day Surgery at [phone], Monday  through Friday 6 am to 4 pm, or [place] [facility] [place] Main number at [phone].  

## 2023-02-04 NOTE — Anesthesia Postprocedure Evaluation (Signed)
Anesthesia Post Note  Patient: Clinton Johns  Procedure(s) Performed: XI ROBOTIC ASSISTED VENTRAL HERNIA w/ mesh (Abdomen)  Patient location during evaluation: PACU Anesthesia Type: General Level of consciousness: awake and alert Pain management: pain level controlled Vital Signs Assessment: post-procedure vital signs reviewed and stable Respiratory status: spontaneous breathing, nonlabored ventilation, respiratory function stable and patient connected to nasal cannula oxygen Cardiovascular status: blood pressure returned to baseline and stable Postop Assessment: no apparent nausea or vomiting Anesthetic complications: no  No notable events documented.   Last Vitals:  Vitals:   02/04/23 0943 02/04/23 0945  BP: (!) 138/93 (!) 138/93  Pulse: 84 80  Resp: 17 17  Temp: (!) 36.4 C   SpO2: 98% 98%    Last Pain:  Vitals:   02/04/23 0943  TempSrc:   PainSc: Asleep                 Stephanie Coup

## 2023-02-04 NOTE — Op Note (Signed)
Preoperative diagnosis: Umbilical hernia  Postoperative diagnosis: Incarcerated Umbilical hernia  Procedure: Robotic assisted laparoscopic incarcerated umbilical hernia repair with mesh  Anesthesia: general  Surgeon: Carolan Shiver, MD, FACS  Wound Classification: Clean  Specimen: none  Complications: None  Estimated Blood Loss: 5 mL  Indications: A 42 year old male with symptomatic umbilical hernia. Repair indicated to improve pain and avoid complications such as strangulation.   Findings: 3 cm incarcerated umbilical hernia 2.  Tension free repair achieved with 11.4 cm round bard mesh and suture 3.  Adequate hemostasis  Description of procedure: The patient was brought to the operating room and general anesthesia was induced. A time-out was completed verifying correct patient, procedure, site, positioning, and implant(s) and/or special equipment prior to beginning this procedure. Antibiotics were administered prior to making the incision. SCDs placed. The anterior abdominal wall was prepped and draped in the standard sterile fashion.   Palmer's point chosen for entry.  Veress needle placed and abdomen insufflated to 15cm without any dramatic increase in pressure.  Needle removed and optiview technique used to place 8 mm port at same point.  No injury noted during placement. 2 additional ports were placed along left lateral aspect.  Xi robot then docked into place.  Hernia contents noted and reduced with combination of blunt, sharp dissection with scissors and fenestrated forceps.  Hemostasis achieved throughout this portion.  Once all hernia contents reduced, there was noted to be a 3 hernia.    Insufflation dropped to 8 mmHg and transfacial suture with 0 stratafix used to primarily close defect under minimal tension. Bard protected 11.4 cm round mesh was placed within the abdominal cavity through the port and secured to the abdominal wall centered over the defect using the 0  stratafix previously used to primarily close defect.  The mesh was then circumferentially sutured into the anterior abdominal wall using 2-0 VLock x2.  Any bleeding noted during this portion was no longer actively bleeding by end of securing mesh and tightening the suture.    Robot was undocked.  Abdomen then desufflated while camera within abdomen to ensure no signs of new bleed prior to removing camera and rest of ports completely.  All skin incisions closed with runninrg 4-0 Monocryl in a subcuticular fashion.  All wounds then dressed with Dermabond.  Patient was then successfully awakened and transferred to PACU in stable condition.  At the end of the procedure sponge and instrument counts were correct.

## 2024-10-03 ENCOUNTER — Ambulatory Visit
Admission: EM | Admit: 2024-10-03 | Discharge: 2024-10-03 | Disposition: A | Discharge status: Home / Self Care | Attending: Emergency Medicine | Admitting: Emergency Medicine

## 2024-10-03 DIAGNOSIS — J0100 Acute maxillary sinusitis, unspecified: Secondary | ICD-10-CM

## 2024-10-03 LAB — POCT RAPID STREP A (OFFICE): Rapid Strep A Screen: NEGATIVE

## 2024-10-03 MED ORDER — AMOXICILLIN-POT CLAVULANATE 875-125 MG PO TABS: 1.0000 | ORAL_TABLET | Freq: Two times a day (BID) | ORAL | 0 refills | Status: AC

## 2024-10-03 NOTE — ED Triage Notes (Signed)
 Sore throat, congestion x 5 days. Taking mucinex with some relief of congestion.

## 2024-10-03 NOTE — Discharge Instructions (Addendum)
Take the Augmentin as directed for sinus infection.  Follow up with your primary care provider if your symptoms are not improving.

## 2024-10-03 NOTE — ED Provider Notes (Signed)
 " Clinton Johns    CSN: [id number] Arrival date & time: 10/03/24  9070      History   Chief Complaint Chief Complaint  Patient presents with   Sore Throat    HPI Clinton Johns is a 44 y.o. male.  Patient presents with 1 week history of congestion, postnasal drainage, sore throat.  No fever, shortness of breath, vomiting, diarrhea.  He has been treating his symptoms with Mucinex D.  The history is provided by the patient and medical records.    Past Medical History:  Diagnosis Date   Sleep apnea    cpap    Patient Active Problem List   Diagnosis Date Noted   Arthritis 01/27/2018   History of skin cancer 01/27/2018   Sleep apnea 01/27/2018   Hyperlipidemia 02/10/2016    Past Surgical History:  Procedure Laterality Date   HERNIA REPAIR Bilateral    groin   HERNIA REPAIR     lower abdomen   XI ROBOTIC ASSISTED VENTRAL HERNIA N/A 02/04/2023   Procedure: XI ROBOTIC ASSISTED VENTRAL HERNIA w/ mesh;  Surgeon: Rodolph Romano, MD;  Location: [facility] ORS;  Service: General;  Laterality: N/A;       Home Medications    Prior to Admission medications  Medication Sig Start Date End Date Taking? Authorizing Provider  amoxicillin -clavulanate (AUGMENTIN ) 875-125 MG tablet Take 1 tablet by mouth every 12 (twelve) hours. 10/03/24  Yes Corlis Burnard DEL, NP    Family History History reviewed. No pertinent family history.  Social History Social History[1]   Allergies   Patient has no known allergies.   Review of Systems Review of Systems  Constitutional:  Negative for chills and fever.  HENT:  Positive for congestion, postnasal drip and sore throat. Negative for ear pain.   Respiratory:  Negative for cough and shortness of breath.      Physical Exam Triage Vital Signs ED Triage Vitals [10/03/24 0954]  Encounter Vitals Group     BP      Girls Systolic BP Percentile      Girls Diastolic BP Percentile      Boys Systolic BP Percentile      Boys  Diastolic BP Percentile      Pulse      Resp      Temp      Temp src      SpO2      Weight      Height      Head Circumference      Peak Flow      Pain Score 3     Pain Loc      Pain Education      Exclude from Growth Chart    No data found.  Updated Vital Signs BP 120/84 (BP Location: Right Arm)   Pulse 85   Temp 98.1 F (36.7 C) (Oral)   Resp 18   SpO2 95%   Visual Acuity Right Eye Distance:   Left Eye Distance:   Bilateral Distance:    Right Eye Near:   Left Eye Near:    Bilateral Near:     Physical Exam Constitutional:      General: He is not in acute distress. HENT:     Right Ear: Tympanic membrane normal.     Left Ear: Tympanic membrane normal.     Nose: Congestion present.     Mouth/Throat:     Mouth: Mucous membranes are moist.     Pharynx: Posterior oropharyngeal erythema  present.  Cardiovascular:     Rate and Rhythm: Normal rate and regular rhythm.     Heart sounds: Normal heart sounds.  Pulmonary:     Effort: Pulmonary effort is normal. No respiratory distress.     Breath sounds: Normal breath sounds.  Neurological:     Mental Status: He is alert.      UC Treatments / Results  Labs (all labs ordered are listed, but only abnormal results are displayed) Labs Reviewed  POCT RAPID STREP A (OFFICE)    EKG   Radiology No results found.  Procedures Procedures (including critical care time)  Medications Ordered in UC Medications - No data to display  Initial Impression / Assessment and Plan / UC Course  I have reviewed the triage vital signs and the nursing notes.  Pertinent labs & imaging results that were available during my care of the patient were reviewed by me and considered in my medical decision making (see chart for details).    Acute sinusitis.  Afebrile and vital signs are stable.  Lungs are clear and O2 sat is 95% on room air.  Treating today with Augmentin  for sinus infection.  Tylenol  or ibuprofen as needed, Mucinex as  needed.  Instructed patient to follow-up with his PCP if he is not improving.  Education provided on sinus infection.  He agrees to plan of care.  Final Clinical Impressions(s) / UC Diagnoses   Final diagnoses:  Acute non-recurrent maxillary sinusitis     Discharge Instructions      Take the Augmentin  as directed for sinus infection.  Follow-up with your primary care provider if your symptoms are not improving.      ED Prescriptions     Medication Sig Dispense Auth. Provider   amoxicillin -clavulanate (AUGMENTIN ) 875-125 MG tablet Take 1 tablet by mouth every 12 (twelve) hours. 14 tablet Corlis Burnard DEL, NP      PDMP not reviewed this encounter.    [1]  Social History Tobacco Use   Smoking status: Never   Smokeless tobacco: Never  Vaping Use   Vaping status: Never Used  Substance Use Topics   Alcohol use: Not Currently   Drug use: Never     Corlis Burnard DEL, NP 10/03/24 1029  "
